# Patient Record
Sex: Male | Born: 1975 | Hispanic: Yes | Marital: Single | Smoking: Former smoker
Health system: Southern US, Community
[De-identification: ages and names within clinical notes are randomized; demographics above are authoritative.]

## PROBLEM LIST (undated history)

## (undated) DIAGNOSIS — K769 Liver disease, unspecified: Secondary | ICD-10-CM

## (undated) DIAGNOSIS — K219 Gastro-esophageal reflux disease without esophagitis: Secondary | ICD-10-CM

## (undated) HISTORY — DX: Gastro-esophageal reflux disease without esophagitis: K21.9

## (undated) HISTORY — PX: KNEE SURGERY: SHX244

## (undated) HISTORY — DX: Liver disease, unspecified: K76.9

## (undated) HISTORY — PX: JOINT REPLACEMENT: SHX530

---

## 2019-09-29 ENCOUNTER — Encounter: Payer: Self-pay | Admitting: Nurse Practitioner

## 2019-10-12 DIAGNOSIS — F1010 Alcohol abuse, uncomplicated: Secondary | ICD-10-CM

## 2019-10-12 DIAGNOSIS — K5900 Constipation, unspecified: Secondary | ICD-10-CM | POA: Insufficient documentation

## 2019-10-12 DIAGNOSIS — K219 Gastro-esophageal reflux disease without esophagitis: Secondary | ICD-10-CM | POA: Insufficient documentation

## 2019-10-12 DIAGNOSIS — R718 Other abnormality of red blood cells: Secondary | ICD-10-CM | POA: Insufficient documentation

## 2019-10-12 DIAGNOSIS — K920 Hematemesis: Secondary | ICD-10-CM | POA: Insufficient documentation

## 2019-10-12 DIAGNOSIS — R1013 Epigastric pain: Secondary | ICD-10-CM | POA: Insufficient documentation

## 2019-10-12 DIAGNOSIS — I10 Essential (primary) hypertension: Secondary | ICD-10-CM

## 2019-10-12 DIAGNOSIS — E119 Type 2 diabetes mellitus without complications: Secondary | ICD-10-CM

## 2019-10-12 HISTORY — DX: Alcohol abuse, uncomplicated: F10.10

## 2019-10-12 HISTORY — DX: Essential (primary) hypertension: I10

## 2019-10-12 HISTORY — DX: Type 2 diabetes mellitus without complications: E11.9

## 2019-10-18 ENCOUNTER — Ambulatory Visit: Payer: Self-pay | Admitting: Nurse Practitioner

## 2019-10-18 ENCOUNTER — Other Ambulatory Visit (INDEPENDENT_AMBULATORY_CARE_PROVIDER_SITE_OTHER): Payer: Self-pay

## 2019-10-18 ENCOUNTER — Encounter: Payer: Self-pay | Admitting: Nurse Practitioner

## 2019-10-18 VITALS — BP 112/70 | HR 72 | Ht 67.5 in | Wt 170.0 lb

## 2019-10-18 DIAGNOSIS — R194 Change in bowel habit: Secondary | ICD-10-CM

## 2019-10-18 DIAGNOSIS — F1010 Alcohol abuse, uncomplicated: Secondary | ICD-10-CM

## 2019-10-18 DIAGNOSIS — R1013 Epigastric pain: Secondary | ICD-10-CM

## 2019-10-18 DIAGNOSIS — K920 Hematemesis: Secondary | ICD-10-CM

## 2019-10-18 LAB — CBC
HCT: 34.6 % — ABNORMAL LOW (ref 39.0–52.0)
Hemoglobin: 12.1 g/dL — ABNORMAL LOW (ref 13.0–17.0)
MCHC: 35 g/dL (ref 30.0–36.0)
MCV: 92.7 fl (ref 78.0–100.0)
Platelets: 288 10*3/uL (ref 150.0–400.0)
RBC: 3.73 Mil/uL — ABNORMAL LOW (ref 4.22–5.81)
RDW: 13.4 % (ref 11.5–15.5)
WBC: 6.5 10*3/uL (ref 4.0–10.5)

## 2019-10-18 LAB — HEPATIC FUNCTION PANEL
ALT: 12 U/L (ref 0–53)
AST: 20 U/L (ref 0–37)
Albumin: 4.4 g/dL (ref 3.5–5.2)
Alkaline Phosphatase: 98 U/L (ref 39–117)
Bilirubin, Direct: 0.1 mg/dL (ref 0.0–0.3)
Total Bilirubin: 0.4 mg/dL (ref 0.2–1.2)
Total Protein: 7.6 g/dL (ref 6.0–8.3)

## 2019-10-18 LAB — LIPASE: Lipase: 34 U/L (ref 11.0–59.0)

## 2019-10-18 NOTE — Patient Instructions (Signed)
CALL THE BILLING DEPARTMENT FIRST THING TOMORROW TO SET UP A PAYMENT PLAN.   You have been scheduled for an endoscopy. Please follow written instructions given to you at your visit today. If you use inhalers (even only as needed), please bring them with you on the day of your procedure.   Your provider has requested that you go to the basement level for lab work before leaving today. Press "B" on the elevator. The lab is located at the first door on the left as you exit the elevator.    I appreciate the opportunity to care for you. Willette Cluster, NP-C

## 2019-10-18 NOTE — Progress Notes (Signed)
ASSESSMENT / PLAN:   44 year old male with PMH significant for alcohol abuse, hyperlipidemia, diabetes and hypertension  # Epigastric burning / nausea / vomiting / occasional hematemesis --Symptoms present for 6 months --Hemoglobin in March was normal.   Will update CBC today --Continue daily PPI --Will arrange for EGD. Dr. Barron Alvine has an opening next week.   --Advised to stop or significantly reduce alcohol consumption for the time being,  at least until evaluation complete --Given history of alcohol intake will obtain lipase level --If EGD negative consider RUQ ultrasound as he does have some tenderness in the RUQ  # Bowel changes --Having normal bowel movements alternating with constipation/diarrhea --Patient is at age for colon cancer screening.  Will plan for this to be done at a later date after resolution of more acute issues.  --In the interim he will apply for financial assistance.   # History of alcohol abuse --In the last 6 months he reduced amount to only 4-6 beers daily.  Patient mentions that he was told several years ago in [place] that he could have cirrhosis. --On exam patient does not have stigmata of chronic liver disease.  His liver test/CBC were normal in March.  I will update his labs --If liver test abnormal will obtain RUQ ultrasound --Advised to discontinue or significantly reduce consumption of EtOH    HPI:     Chief Complaint: Epigastric pain, bowel changes, hematemesis   Jeff Allen is a 44 year old male who speaks limited English.  He is here with a [place] interpreter.  Patient gives a 54-month history of burning pain in epigastrium.  Also for the last 6 months he has had vomiting on a daily basis and occasional episodes of hematemesis.  No heartburn no regurgitation.  No dysphagia.  No NSAID use . He has also had bowel changes in the form of alternating diarrhea, constipation and normal stools.  Patient eats a lot of fruits and  vegetables, feels like fiber intake is adequate.  A few times he has seen black stool but none in the last 3 weeks.  He has been on a PPI 1 sometimes 2 times a day with little improvement in upper abdominal symptoms.  He vomits only saliva with blood.  He does not vomit food and the vomiting is mostly in the a.m.  He does smoke marijuana but no more than once a month.  Patient says he used to be a heavy drinker but in the last 6 months he has reduced amount to only 4-6 beers a day.  Patient said that he was told several years in [place] that he could have cirrhosis.  Labs from PCPs office drawn early March show normal hemoglobin of 13, normal white count, normal platelets, normal renal function, normal liver test with the exception of alkaline phosphatase which was high at 183.   Past Medical History:  Diagnosis Date  . Alcohol abuse 10/12/2019  . Essential hypertension, benign 10/12/2019  . GERD (gastroesophageal reflux disease)   . Type 2 diabetes mellitus without complication (HCC) 10/12/2019    Past Surgical History:  Procedure Laterality Date  . JOINT REPLACEMENT    . KNEE SURGERY     Family History  Problem Relation Age of Onset  . Diabetes Mother   . Diabetes Father    Social History   Tobacco Use  . Smoking status: Former Smoker  Substance Use Topics  . Alcohol use: Not on file  .  Drug use: Not on file   Current Outpatient Medications  Medication Sig Dispense Refill  . atorvastatin (LIPITOR) 10 MG tablet Take 10 mg by mouth daily.    Marland Kitchen lisinopril (ZESTRIL) 20 MG tablet Take 20 mg by mouth daily.    . metFORMIN (GLUCOPHAGE-XR) 500 MG 24 hr tablet Take 1 tablet by mouth 2 (two) times daily.    . pantoprazole (PROTONIX) 40 MG tablet Take 40 mg by mouth daily.    . polyethylene glycol (MIRALAX / GLYCOLAX) 17 g packet Take 17 g by mouth daily.     No current facility-administered medications for this visit.   No Known Allergies   Review of Systems: Positive for allergies,  muscle pain and cramps, sleeping problems, swelling of feet and legs.  All other systems reviewed and negative except where noted in HPI.   Creatinine clearance cannot be calculated (No successful lab value found.)   Physical Exam:    Wt Readings from Last 3 Encounters:  No data found for Wt    BP 112/70   Pulse 72   Ht 5' 7.5" (1.715 m)   Wt 170 lb (77.1 kg)   BMI 26.23 kg/m  Constitutional:  Pleasant male in no acute distress. Psychiatric: Normal mood and affect. Behavior is normal. EENT: Pupils normal.  Conjunctivae are normal. No scleral icterus. Neck supple.  Cardiovascular: Normal rate, regular rhythm. No edema Pulmonary/chest: Effort normal and breath sounds normal. No wheezing, rales or rhonchi. Abdominal: Soft, nondistended, nontender. Bowel sounds active throughout. There are no masses palpable. No hepatomegaly. Neurological: Alert and oriented to person place and time. Skin: Skin is warm and dry. No rashes noted.  Tye Savoy, NP  10/18/2019, 3:45 PM  Cc:  Referring Provider Cathleen Corti, PA-C

## 2019-10-21 ENCOUNTER — Other Ambulatory Visit: Payer: Self-pay | Admitting: Gastroenterology

## 2019-10-21 ENCOUNTER — Ambulatory Visit (INDEPENDENT_AMBULATORY_CARE_PROVIDER_SITE_OTHER): Payer: Self-pay

## 2019-10-21 DIAGNOSIS — Z1159 Encounter for screening for other viral diseases: Secondary | ICD-10-CM

## 2019-10-25 LAB — SARS CORONAVIRUS 2 (TAT 6-24 HRS): SARS Coronavirus 2: NEGATIVE

## 2019-10-26 ENCOUNTER — Ambulatory Visit (AMBULATORY_SURGERY_CENTER): Payer: Self-pay | Admitting: Gastroenterology

## 2019-10-26 ENCOUNTER — Encounter: Payer: Self-pay | Admitting: Gastroenterology

## 2019-10-26 ENCOUNTER — Other Ambulatory Visit: Payer: Self-pay

## 2019-10-26 ENCOUNTER — Telehealth: Payer: Self-pay

## 2019-10-26 VITALS — BP 128/76 | HR 63 | Temp 96.8°F | Resp 21 | Ht 67.0 in | Wt 170.0 lb

## 2019-10-26 DIAGNOSIS — K2970 Gastritis, unspecified, without bleeding: Secondary | ICD-10-CM

## 2019-10-26 DIAGNOSIS — R1013 Epigastric pain: Secondary | ICD-10-CM

## 2019-10-26 DIAGNOSIS — R197 Diarrhea, unspecified: Secondary | ICD-10-CM

## 2019-10-26 DIAGNOSIS — B9681 Helicobacter pylori [H. pylori] as the cause of diseases classified elsewhere: Secondary | ICD-10-CM

## 2019-10-26 DIAGNOSIS — K920 Hematemesis: Secondary | ICD-10-CM

## 2019-10-26 DIAGNOSIS — R112 Nausea with vomiting, unspecified: Secondary | ICD-10-CM

## 2019-10-26 DIAGNOSIS — K2950 Unspecified chronic gastritis without bleeding: Secondary | ICD-10-CM

## 2019-10-26 MED ORDER — SODIUM CHLORIDE 0.9 % IV SOLN: 500.0000 mL | Freq: Once | INTRAVENOUS | Status: DC

## 2019-10-26 MED ORDER — PANTOPRAZOLE SODIUM 40 MG PO TBEC: DELAYED_RELEASE_TABLET | ORAL | 3 refills | Status: AC

## 2019-10-26 NOTE — Telephone Encounter (Signed)
Patient is scheduled to see Dr. Barron Alvine on 10/26/19 at 4pm. Tried to call patient on 10/21/19 to see if patient could come in early for his procedure. I was unable to reach patient, and was unable to leave a voicemail. Also tried to contact patient yesterday 10/25/19, and this morning 10/26/19 to see if patient could come in early, and was unsuccessful in reaching the patient.

## 2019-10-26 NOTE — Progress Notes (Signed)
pt tolerated well. VSS. awake and to recovery. Report given to RN.  

## 2019-10-26 NOTE — Patient Instructions (Signed)
Handout given for Gastritis.  Pick up your new prescription today.  YOU HAD AN ENDOSCOPIC PROCEDURE TODAY AT THE [facility]:   Refer to the procedure report that was given to you for any specific questions about what was found during the examination.  If the procedure report does not answer your questions, please call your gastroenterologist to clarify.  If you requested that your care partner not be given the details of your procedure findings, then the procedure report has been included in a sealed envelope for you to review at your convenience later.  YOU SHOULD EXPECT: Some feelings of bloating in the abdomen. Passage of more gas than usual.  Walking can help get rid of the air that was put into your GI tract during the procedure and reduce the bloating. If you had a lower endoscopy (such as a colonoscopy or flexible sigmoidoscopy) you may notice spotting of blood in your stool or on the toilet paper. If you underwent a bowel prep for your procedure, you may not have a normal bowel movement for a few days.  Please Note:  You might notice some irritation and congestion in your nose or some drainage.  This is from the oxygen used during your procedure.  There is no need for concern and it should clear up in a day or so.  SYMPTOMS TO REPORT IMMEDIATELY:   Following upper endoscopy (EGD)  Vomiting of blood or coffee ground material  New chest pain or pain under the shoulder blades  Painful or persistently difficult swallowing  New shortness of breath  Fever of 100F or higher  Black, tarry-looking stools  For urgent or emergent issues, a gastroenterologist can be reached at any hour by calling [phone]. Do not use MyChart messaging for urgent concerns.    DIET:  We do recommend a small meal at first, but then you may proceed to your regular diet.  Drink plenty of fluids but you should avoid alcoholic beverages for 24 hours.  ACTIVITY:  You should plan to take it easy  for the rest of today and you should NOT DRIVE or use heavy machinery until tomorrow (because of the sedation medicines used during the test).    FOLLOW UP: Our staff will call the number listed on your records 48-72 hours following your procedure to check on you and address any questions or concerns that you may have regarding the information given to you following your procedure. If we do not reach you, we will leave a message.  We will attempt to reach you two times.  During this call, we will ask if you have developed any symptoms of COVID 19. If you develop any symptoms (ie: fever, flu-like symptoms, shortness of breath, cough etc.) before then, please call [phone].  If you test positive for Covid 19 in the 2 weeks post procedure, please call and report this information to [place].    If any biopsies were taken you will be contacted by phone or by letter within the next 1-3 weeks.  Please call us at [phone] if you have not heard about the biopsies in 3 weeks.    SIGNATURES/CONFIDENTIALITY: You and/or your care partner have signed paperwork which will be entered into your electronic medical record.  These signatures attest to the fact that that the information above on your After Visit Summary has been reviewed and is understood.  Full responsibility of the confidentiality of this discharge information lies with you and/or your care-partner.

## 2019-10-26 NOTE — Op Note (Signed)
[name] Procedure Date: 10/26/2019 3:23 PM MRN: [id number] Endoscopist: Doristine Locks , MD Age: 44 Referring MD:  Date of Birth: 10-13-1975 Gender: Male Account #: [id number] Procedure:                Upper GI endoscopy Indications:              Epigastric abdominal pain, Upper abdominal pain,                            Hematemesis, Diarrhea/Change in bowel habits,                            Nausea with vomiting Medicines:                Monitored Anesthesia Care Procedure:                Pre-Anesthesia Assessment:                           - Prior to the procedure, a History and Physical                            was performed, and patient medications and                            allergies were reviewed. The patient's tolerance of                            previous anesthesia was also reviewed. The risks                            and benefits of the procedure and the sedation                            options and risks were discussed with the patient.                            All questions were answered, and informed consent                            was obtained. Prior Anticoagulants: The patient has                            taken no previous anticoagulant or antiplatelet                            agents. ASA Grade Assessment: III - A patient with                            severe systemic disease. After reviewing the risks                            and benefits, the patient was deemed in  satisfactory condition to undergo the procedure.                           After obtaining informed consent, the endoscope was                            passed under direct vision. Throughout the                            procedure, the patient's blood pressure, pulse, and                            oxygen saturations were monitored continuously. The                            Endoscope was introduced  through the mouth, and                            advanced to the second part of duodenum. The upper                            GI endoscopy was accomplished without difficulty.                            The patient tolerated the procedure well. Scope In: Scope Out: Findings:                 The examined esophagus was normal.                           The Z-line was regular and was found 38 cm from the                            incisors.                           The gastroesophageal flap valve was visualized                            endoscopically and classified as Hill Grade II                            (fold present, opens with respiration).                           Diffuse mild inflammation characterized by erythema                            was found in the gastric body and in the gastric                            antrum. Biopsies were taken with a cold forceps for                            Helicobacter pylori testing.  Estimated blood loss                            was minimal.                           The duodenal bulb, first portion of the duodenum                            and second portion of the duodenum were normal.                            Biopsies for histology were taken with a cold                            forceps for evaluation of celiac disease. Estimated                            blood loss was minimal. Complications:            No immediate complications. Estimated Blood Loss:     Estimated blood loss was minimal. Impression:               - Normal esophagus.                           - Z-line regular, 38 cm from the incisors.                           - Gastroesophageal flap valve classified as Hill                            Grade II (fold present, opens with respiration).                           - Gastritis. Biopsied.                           - Normal duodenal bulb, first portion of the                            duodenum and second portion of  the duodenum.                            Biopsied. Recommendation:           - Patient has a contact number available for                            emergencies. The signs and symptoms of potential                            delayed complications were discussed with the                            patient. Return to normal activities tomorrow.  Written discharge instructions were provided to the                            patient.                           - Resume previous diet.                           - Continue present medications.                           - Await pathology results.                           - Increase Protonix (pantoprazole) to 40 mg PO BID                            for 6 weeks to promote mucosal healing, then reduce                            back to 40 mg daily and continue to reduce to                            lowest effective dose if symptoms well controlled.                           - Return to GI clinic in 6 weeks. Doristine Locks, MD 10/26/2019 3:57:28 PM

## 2019-10-26 NOTE — Progress Notes (Signed)
Called to room to assist during endoscopic procedure.  Patient ID and intended procedure confirmed with present staff. Received instructions for my participation in the procedure from the performing physician.  

## 2019-10-28 ENCOUNTER — Telehealth: Payer: Self-pay

## 2019-10-28 NOTE — Telephone Encounter (Signed)
No answer on follow up call. No voicemail set up.  

## 2019-10-28 NOTE — Telephone Encounter (Signed)
2nd follow up call made.  NAULM 

## 2019-11-08 ENCOUNTER — Other Ambulatory Visit: Payer: Self-pay | Admitting: Gastroenterology

## 2019-11-08 DIAGNOSIS — K2970 Gastritis, unspecified, without bleeding: Secondary | ICD-10-CM

## 2019-11-08 MED ORDER — METRONIDAZOLE 250 MG PO TABS
250.0000 mg | ORAL_TABLET | Freq: Four times a day (QID) | ORAL | 0 refills | Status: AC
Start: 2019-11-08 — End: 2019-11-22

## 2019-11-08 MED ORDER — BISMUTH SUBSALICYLATE 262 MG PO CHEW
524.0000 mg | CHEWABLE_TABLET | Freq: Four times a day (QID) | ORAL | 0 refills | Status: AC
Start: 2019-11-08 — End: 2019-11-22

## 2019-11-08 MED ORDER — DOXYCYCLINE HYCLATE 100 MG PO TABS
100.0000 mg | ORAL_TABLET | Freq: Two times a day (BID) | ORAL | 0 refills | Status: AC
Start: 2019-11-08 — End: 2019-11-22

## 2019-11-16 ENCOUNTER — Encounter: Payer: Self-pay | Admitting: Gastroenterology

## 2020-01-04 ENCOUNTER — Telehealth: Payer: Self-pay | Admitting: Gastroenterology

## 2020-01-04 NOTE — Telephone Encounter (Signed)
Tried calling Mr Jeff Allen with no answer and no voicemail. I also tried his brother Jeff Allen listed as his emergency contact, he also was no answer and does not have his voicemail set up to be able to leave a message. Will try again before I leave for the end of the day.

## 2020-01-04 NOTE — Telephone Encounter (Signed)
Patient had EGD on 10/26/19.Biopsy confirmed H Pylori. Patient was started on quad therapy with antibiotics and to recheck stool around 12/20/19. Confirmed with [facility] that patient picked up his medication on 11/15/19. Patient continues to complain of pain. Has not rechecked stool at this time. Lady Gary will contact patient to let him know that he needs to get his lab work done and schedule an appointment for his abdominal pain.

## 2020-01-20 ENCOUNTER — Ambulatory Visit: Payer: Self-pay | Admitting: Gastroenterology

## 2020-01-20 ENCOUNTER — Encounter: Payer: Self-pay | Admitting: Gastroenterology

## 2020-01-20 VITALS — BP 140/80 | HR 73 | Ht 68.5 in | Wt 164.0 lb

## 2020-01-20 DIAGNOSIS — R112 Nausea with vomiting, unspecified: Secondary | ICD-10-CM

## 2020-01-20 DIAGNOSIS — F1210 Cannabis abuse, uncomplicated: Secondary | ICD-10-CM

## 2020-01-20 DIAGNOSIS — B9681 Helicobacter pylori [H. pylori] as the cause of diseases classified elsewhere: Secondary | ICD-10-CM

## 2020-01-20 DIAGNOSIS — R1013 Epigastric pain: Secondary | ICD-10-CM

## 2020-01-20 DIAGNOSIS — K2970 Gastritis, unspecified, without bleeding: Secondary | ICD-10-CM

## 2020-01-20 NOTE — Patient Instructions (Signed)
If you are age 44 or older, your body mass index should be between 23-30. Your Body mass index is 24.57 kg/m. If this is out of the aforementioned range listed, please consider follow up with your Primary Care Provider.  If you are age 36 or younger, your body mass index should be between 19-25. Your Body mass index is 24.57 kg/m. If this is out of the aformentioned range listed, please consider follow up with your Primary Care Provider.   You have been scheduled for a gastric emptying scan at [facility] on 01/25/20 at 9:30 am. Please arrive at least 15 minutes prior to your appointment for registration. Please make certain not to have anything to eat or drink after midnight the night before your test. Hold all stomach medications (ex: Zofran, phenergan, Reglan) 48 hours prior to your test. If you need to reschedule your appointment, please contact radiology scheduling at [phone]. _____________________________________________________________________ A gastric-emptying study measures how long it takes for food to move through your stomach. There are several ways to measure stomach emptying. In the most common test, you eat food that contains a small amount of radioactive material. A scanner that detects the movement of the radioactive material is placed over your abdomen to monitor the rate at which food leaves your stomach. This test normally takes about 4 hours to complete. _____________________________________________________________________  Your provider has requested that you go to the basement level for lab work at [address], [place]. Press "B" on the elevator. The lab is located at the first door on the left as you exit the elevator. STOP PROTONIX TWO WEEKS BEFORE 02/20/20  Follow up with me on 03/22/20 at 8:00 am.  It was a pleasure to see you today!  Vito Cirigliano, D.O.

## 2020-01-20 NOTE — Progress Notes (Signed)
P  Chief Complaint:    MEG pain, nausea/vomiting, hospital f/u  GI History: 44 year old male with history of EtOH use disorder (has since quit), HLD, diabetes, HTN, initially seen in the GI clinic on 10/18/2019 with c/o:  1) MEG pain, nausea/vomiting, hematemesis: Symptoms started late 2020 with daily nausea/vomiting and intermittent hematemesis.  No heartburn, regurgitation, dysphagia.  Marijuana twice/week.  Prior heavy EtOH use, but decreased to 4-6 beers/day with symptom onset; has since quit. -09/2019: H/H 12.1/34.6, MCV 92.7.  Normal liver enzymes, lipase -EGD with H. pylori gastritis; treated with quadruple therapy -Treated with high-dose PPI x6 weeks -12/24/2019: Admitted to [facility] with left sided pneumomediastinum, treated with ABX and conservative management -01/18/2020: Admitted to [facility] with chest pain and persistent nausea/vomiting.  CT unremarkable, responded to conservative management  2) Change in bowel habits: Also started late 2020.  Alternating diarrhea, constipation, and normal stools.  Intermittent dark stools, but no hematochezia.  Lower GI symptoms have since resolved   Endoscopic History: -EGD (10/26/2019, Dr. Barron Alvine): Mild gastritis (biopsies: Positive for H. pylori).  HPI:     Patient is a 44 y.o. male presenting to the Gastroenterology Clinic for follow-up.  Hospital admission 12/24/19 at [facility] in July with left-sided pneumomediastinum with subQ emphysema, nausea/vomiting. CT surgerry consulted- recommended conservative management. Gastrograffin study negative for perforation. Treated with Abx, diflucan. Repeat esophagram negative and was tolerating PO intake. Repeat CT 12/28/19 without perforation. Inpatient GI recommended outpatient f/u and GES and started Reglan.   Was seen in the ER on 01/18/2020 with c/o nausea/vomiting, retching, midsternal chest pain.  CT unremarkable, EKG normal, negative troponins.  Was admitted to [facility] for intractable nausea/vomiting,  treated with antiemetics and was discharged yesterday with PPI, Carafate, Zofran ODT.   Today, he states he feels okay this morning, but continues to have recurrent episodes we can manage him in the hospital earlier this week as above.  Stopped EtOH 2-3 months ago. Marijuana still 2 days/week.  Take medications as prescribed at the time of hospital discharge yesterday, although does not seem to be taking the Reglan despite being in his medical list and along with the bottles of medication he is caring today.  History obtained via interpreter in the room, although patient understands a good amount of English.  Review of systems:     No chest pain, no SOB, no fevers, no urinary sx   Past Medical History:  Diagnosis Date  . Alcohol abuse 10/12/2019  . Essential hypertension, benign 10/12/2019  . GERD (gastroesophageal reflux disease)   . Liver disease   . Type 2 diabetes mellitus without complication (HCC) 10/12/2019    Patient's surgical history, family medical history, social history, medications and allergies were all reviewed in Epic    Current Outpatient Medications  Medication Sig Dispense Refill  . atorvastatin (LIPITOR) 10 MG tablet Take 10 mg by mouth daily.    Marland Kitchen lisinopril (ZESTRIL) 20 MG tablet Take 20 mg by mouth daily.    . metFORMIN (GLUCOPHAGE) 500 MG tablet metformin 500 mg tablet  Take 2 tablets twice a day by oral route.    . metoCLOPramide (REGLAN) 5 MG tablet metoclopramide 5 mg tablet  Take 1 tablet 3 times a day by oral route with meals.    Marland Kitchen oxyCODONE-acetaminophen (PERCOCET/ROXICET) 5-325 MG tablet Take 1 tablet by mouth every 6 (six) hours as needed for severe pain.    . pantoprazole (PROTONIX) 40 MG tablet Increase Protonix to 40 mg twice a day for 6 weeks,  then reduce back to 40 mg daily. 90 tablet 3  . sucralfate (CARAFATE) 1 GM/10ML suspension Take 1 g by mouth 2 (two) times daily before a meal.     No current facility-administered medications for this visit.      Physical Exam:     BP 140/80   Pulse 73   Ht 5' 8.5" (1.74 m)   Wt 164 lb (74.4 kg)   BMI 24.57 kg/m   GENERAL:  Pleasant male in NAD PSYCH: : Cooperative, normal affect EENT:  conjunctiva pink, mucous membranes moist, neck supple without masses, no crepitus CARDIAC:  RRR, no murmur heard, no peripheral edema PULM: Normal respiratory effort, lungs CTA bilaterally, no wheezing ABDOMEN:  Nondistended, soft, nontender. No obvious masses, no hepatomegaly,  normal bowel sounds SKIN:  turgor, no lesions seen Musculoskeletal:  Normal muscle tone, normal strength NEURO: Alert and oriented x 3, no focal neurologic deficits   IMPRESSION and PLAN:    1) Nausea/Vomiting 2) Epigastric pain/Chest pain 3) Recent admission for left-sided pneumomediastinum 4) H. pylori gastritis 5) Cannabis use  - GES.  Needs to be off Reglan x2 weeks.  No pain medications. - Check H pylori stool Ag to confirm eradication after holding PPI x2+ weeks -Counseled on complete cessation of all cannabis -Low-fat/low fiber diet -If work-up unrevealing, plan for CT head  6) History of EtOH use disorder -Has since stopped drinking EtOH.  No withdrawal symptoms or cravings.  Applauded him on his cessation efforts  RTC in 2 months or sooner as needed     I spent 35 minutes of time, including in depth chart review, independent review of results as outlined above, communicating results with the patient directly, face-to-face time with the patient, coordinating care, and ordering studies and medications as appropriate, and documentation.    Verlin Dike Melaina Howerton ,DO, FACG 01/20/2020, 9:12 AM

## 2020-01-25 ENCOUNTER — Encounter (HOSPITAL_COMMUNITY)
Admission: RE | Admit: 2020-01-25 | Discharge: 2020-01-25 | Disposition: A | Discharge status: Home / Self Care | Payer: Self-pay | Source: Ambulatory Visit | Attending: Gastroenterology | Admitting: Gastroenterology

## 2020-01-25 ENCOUNTER — Other Ambulatory Visit: Payer: Self-pay

## 2020-01-25 DIAGNOSIS — R112 Nausea with vomiting, unspecified: Secondary | ICD-10-CM | POA: Insufficient documentation

## 2020-01-25 MED ORDER — TECHNETIUM TC 99M SULFUR COLLOID
1.9200 | Freq: Once | INTRAVENOUS | Status: AC | PRN
  Administered 2020-01-25: 1.92 via INTRAVENOUS

## 2020-02-21 ENCOUNTER — Other Ambulatory Visit: Payer: Self-pay

## 2020-02-21 DIAGNOSIS — B9681 Helicobacter pylori [H. pylori] as the cause of diseases classified elsewhere: Secondary | ICD-10-CM

## 2020-02-22 ENCOUNTER — Other Ambulatory Visit: Payer: Self-pay

## 2020-02-22 DIAGNOSIS — B9681 Helicobacter pylori [H. pylori] as the cause of diseases classified elsewhere: Secondary | ICD-10-CM

## 2020-02-23 LAB — HELICOBACTER PYLORI  SPECIAL ANTIGEN
MICRO NUMBER:: 11010318
SPECIMEN QUALITY: ADEQUATE

## 2020-03-01 ENCOUNTER — Telehealth: Payer: Self-pay

## 2020-03-01 NOTE — Telephone Encounter (Signed)
Per Joey he has tried to contact patient regarding his results. I will ask Joey to send patient a letter in [place].

## 2020-03-06 ENCOUNTER — Encounter: Payer: Self-pay | Admitting: Gastroenterology

## 2020-03-06 NOTE — Progress Notes (Signed)
Called Mr. Bradly Bienenstock but no answer, no voicemail. Called Emergency contact listed Madelin Headings, asked him to have Mr. Bradly Bienenstock call back and ask to speak to me or [place] representative.

## 2020-03-22 ENCOUNTER — Ambulatory Visit: Payer: Self-pay | Admitting: Gastroenterology

## 2020-03-22 ENCOUNTER — Encounter: Payer: Self-pay | Admitting: Gastroenterology

## 2020-03-22 VITALS — BP 136/78 | HR 83 | Ht 69.0 in | Wt 176.4 lb

## 2020-03-22 DIAGNOSIS — R112 Nausea with vomiting, unspecified: Secondary | ICD-10-CM

## 2020-03-22 DIAGNOSIS — Z8619 Personal history of other infectious and parasitic diseases: Secondary | ICD-10-CM

## 2020-03-22 DIAGNOSIS — R1013 Epigastric pain: Secondary | ICD-10-CM

## 2020-03-22 NOTE — Progress Notes (Signed)
P  Chief Complaint:    Nausea/Vomiting, epigastric pain  GI History: 44 year old male with history of EtOH use disorder (has since quit), HLD, diabetes, HTN, initially seen in the GI clinic on 10/18/2019 with c/o:  1) MEG pain, nausea/vomiting, hematemesis: Symptoms started late 2020 with daily nausea/vomiting and intermittent hematemesis.  No heartburn, regurgitation, dysphagia.  Marijuana twice/week.  Prior heavy EtOH use, but decreased to 4-6 beers/day with symptom onset; has since quit. -09/2019: H/H 12.1/34.6, MCV 92.7.  Normal liver enzymes, lipase -EGD with H. pylori gastritis; treated with quadruple therapy -Treated with high-dose PPI x6 weeks -12/24/2019: Admitted to [facility] with left sided pneumomediastinum, treated with ABX and conservative management -01/18/2020: Admitted to [facility] with chest pain and persistent nausea/vomiting.  CT unremarkable, responded to conservative management -01/25/2020: GES: Normal -01/2020: Cirfirmed eradication of H pylori on stool Ag test  2) Change in bowel habits: Also started late 2020.  Alternating diarrhea, constipation, and normal stools.  Intermittent dark stools, but no hematochezia.  Lower GI symptoms have since resolved   Endoscopic History: -EGD (10/26/2019, Dr. Barron Alvine): Mild gastritis (biopsies: Positive for H. pylori).  HPI:     Patient is a 44 y.o. male presenting to the Gastroenterology Clinic for follow-up.  Last seen in GI clinic on 01/20/2020.  At that time, was feeling okay following hospital discharge as above, but still with recurrent episodes.  Had quit all EtOH, but still smoking marijuana 2 days/week.  Was not taking Reglan at that time.  Since last appointment, was evaluated with GES (off Reglan; normal) and repeat stool antigen to confirm eradication of H. pylori.  Recommended low-fat/low fiber diet; no change in his symptoms and has since advance diet back.  Today, he states sxs all unchanged. Still MEG pain, n/v. Weight  stable.   Pain indepepndent of PO intake.  Not taking any of his medications since last appt, including meds for DM, HLD, HTN.  History obtained by Spanish interpreter in room today.  Review of systems:     No chest pain, no SOB, no fevers, no urinary sx   Past Medical History:  Diagnosis Date   Alcohol abuse 10/12/2019   Essential hypertension, benign 10/12/2019   GERD (gastroesophageal reflux disease)    Liver disease    Type 2 diabetes mellitus without complication (HCC) 10/12/2019    Patient's surgical history, family medical history, social history, medications and allergies were all reviewed in Epic    Current Outpatient Medications  Medication Sig Dispense Refill   atorvastatin (LIPITOR) 10 MG tablet Take 10 mg by mouth daily. (Patient not taking: Reported on 03/22/2020)     lisinopril (ZESTRIL) 20 MG tablet Take 20 mg by mouth daily. (Patient not taking: Reported on 03/22/2020)     metFORMIN (GLUCOPHAGE) 500 MG tablet metformin 500 mg tablet  Take 2 tablets twice a day by oral route. (Patient not taking: Reported on 03/22/2020)     metoCLOPramide (REGLAN) 5 MG tablet metoclopramide 5 mg tablet  Take 1 tablet 3 times a day by oral route with meals. (Patient not taking: Reported on 03/22/2020)     oxyCODONE-acetaminophen (PERCOCET/ROXICET) 5-325 MG tablet Take 1 tablet by mouth every 6 (six) hours as needed for severe pain. (Patient not taking: Reported on 03/22/2020)     pantoprazole (PROTONIX) 40 MG tablet Increase Protonix to 40 mg twice a day for 6 weeks, then reduce back to 40 mg daily. (Patient not taking: Reported on 03/22/2020) 90 tablet 3   sucralfate (CARAFATE) 1  GM/10ML suspension Take 1 g by mouth 2 (two) times daily before a meal. (Patient not taking: Reported on 03/22/2020)     No current facility-administered medications for this visit.    Physical Exam:     BP 136/78    Pulse 83    Ht 5\' 9"  (1.753 m)    Wt 176 lb 6 oz (80 kg)    BMI 26.05 kg/m     GENERAL:  Pleasant male in NAD PSYCH: : Cooperative, normal affect EENT:  conjunctiva pink, mucous membranes moist, neck supple without masses CARDIAC:  RRR, no murmur heard, no peripheral edema PULM: Normal respiratory effort, lungs CTA bilaterally, no wheezing ABDOMEN:  Nondistended, soft, nontender. No obvious masses, no hepatomegaly,  normal bowel sounds SKIN:  turgor, no lesions seen Musculoskeletal:  Normal muscle tone, normal strength NEURO: Alert and oriented x 3, no focal neurologic deficits   IMPRESSION and PLAN:    1) Nausea/Vomiting 2) Epigastric pain 3) History of pneumomediastinum  -Has had multiple imaging studies, to include CT abdomen pelvis on 01/18/2020, CT CAP on 12/29/2019, CT abdomen/pelvis on 12/24/2019 due to recent hospitalizations, alcohol unrevealing for GI pathology for presenting symptoms.  Repeat abdominal cross-sectional imaging not needed at this time -CT head to rule out intracranial lesion -If unrevealing, can either test or treat for possible SIBO -Trial course of FD guard.  Provided with samples today -Again discussed cannabinoid hyperemesis syndrome  4) Diabetes -Recommended restarting medications and establish follow-up with PCM  5) History of H. pylori -Eradicated with quadruple therapy  6) Cannabis use -Discussed possibility of cannabinoid hyperemesis syndrome again today      I spent 30 minutes of time, including in depth chart review, independent review of results as outlined above, communicating results with the patient directly, face-to-face time with the patient, coordinating care, and ordering studies and medications as appropriate, and documentation.     12/26/2019 Harlem Thresher ,DO, FACG 03/22/2020, 8:45 AM

## 2020-03-22 NOTE — Patient Instructions (Addendum)
If you are age 44 or older, your body mass index should be between 23-30. Your Body mass index is 26.05 kg/m. If this is out of the aforementioned range listed, please consider follow up with your Primary Care Provider.  If you are age 45 or younger, your body mass index should be between 19-25. Your Body mass index is 26.05 kg/m. If this is out of the aformentioned range listed, please consider follow up with your Primary Care Provider.  __________________________________________________________ We have given you samples of FD guard. Take 1 capsule 2 times a day as directed.   _________________________________________________________ [place] are scheduled on 03/23/2020 at 1:30pm. You should arrive 15 minutes prior to your appointment time for registration. Please follow the written instructions below on the day of your exam:  WARNING: IF YOU ARE ALLERGIC TO IODINE/X-RAY DYE, PLEASE NOTIFY RADIOLOGY IMMEDIATELY AT [phone]! YOU WILL BE GIVEN A 13 HOUR PREMEDICATION PREP.  1) Do not eat or drink anything after 9:30am(4 hours prior to your test) 2) You have been given 2 bottles of oral contrast to drink. The solution may taste better if refrigerated, but do NOT add ice or any other liquid to this solution. Shake well before drinking.    Drink 1 bottle of contrast @ 11:30am (2 hours prior to your exam)  Drink 1 bottle of contrast @ 12:30pm (1 hour prior to your exam)  You may take any medications as prescribed with a small amount of water, if necessary. If you take any of the following medications: METFORMIN, GLUCOPHAGE, GLUCOVANCE, AVANDAMET, RIOMET, FORTAMET, [place] MET, JANUMET, GLUMETZA or METAGLIP, you MAY be asked to HOLD this medication 48 hours AFTER the exam.  The purpose of you drinking the oral contrast is to aid in the visualization of your intestinal tract. The contrast solution may cause some diarrhea. Depending on your individual set of symptoms, you may  also receive an intravenous injection of x-ray contrast/dye. Plan on being at [facility] for 30 minutes or longer, depending on the type of exam you are having performed.  This test typically takes 30-45 minutes to complete. ___________________________________________________________  Due to recent changes in healthcare laws, you may see the results of your imaging and laboratory studies on MyChart before your provider has had a chance to review them.  We understand that in some cases there may be results that are confusing or concerning to you. Not all laboratory results come back in the same time frame and the provider may be waiting for multiple results in order to interpret others.  Please give [place] 48 hours in order for your provider to thoroughly review all the results before contacting the office for clarification of your results.

## 2020-03-23 ENCOUNTER — Other Ambulatory Visit: Payer: Self-pay

## 2020-03-23 ENCOUNTER — Ambulatory Visit (HOSPITAL_BASED_OUTPATIENT_CLINIC_OR_DEPARTMENT_OTHER): Admission: RE | Admit: 2020-03-23 | Payer: Self-pay | Source: Ambulatory Visit

## 2020-03-23 ENCOUNTER — Telehealth: Payer: Self-pay | Admitting: General Surgery

## 2020-03-23 ENCOUNTER — Ambulatory Visit (HOSPITAL_BASED_OUTPATIENT_CLINIC_OR_DEPARTMENT_OTHER)
Admission: RE | Admit: 2020-03-23 | Discharge: 2020-03-23 | Disposition: A | Discharge status: Home / Self Care | Payer: Self-pay | Source: Ambulatory Visit | Attending: Gastroenterology | Admitting: Gastroenterology

## 2020-03-23 DIAGNOSIS — R112 Nausea with vomiting, unspecified: Secondary | ICD-10-CM | POA: Insufficient documentation

## 2020-03-23 NOTE — Telephone Encounter (Signed)
Changed to ct without contrast. No labs were completed for contrast

## 2020-06-01 ENCOUNTER — Other Ambulatory Visit (INDEPENDENT_AMBULATORY_CARE_PROVIDER_SITE_OTHER): Payer: Self-pay

## 2020-06-01 ENCOUNTER — Other Ambulatory Visit: Payer: Self-pay

## 2020-06-01 ENCOUNTER — Encounter: Payer: Self-pay | Admitting: Gastroenterology

## 2020-06-01 ENCOUNTER — Ambulatory Visit (INDEPENDENT_AMBULATORY_CARE_PROVIDER_SITE_OTHER): Payer: Self-pay | Admitting: Gastroenterology

## 2020-06-01 VITALS — BP 138/72 | HR 76 | Ht 69.0 in | Wt 171.0 lb

## 2020-06-01 DIAGNOSIS — R1013 Epigastric pain: Secondary | ICD-10-CM

## 2020-06-01 DIAGNOSIS — R112 Nausea with vomiting, unspecified: Secondary | ICD-10-CM

## 2020-06-01 DIAGNOSIS — E119 Type 2 diabetes mellitus without complications: Secondary | ICD-10-CM

## 2020-06-01 LAB — CREATININE, SERUM: Creat: 0.92 mg/dL (ref 0.60–1.35)

## 2020-06-01 MED ORDER — PROCHLORPERAZINE MALEATE 10 MG PO TABS: 10.0000 mg | ORAL_TABLET | Freq: Three times a day (TID) | ORAL | 3 refills | Status: AC | PRN

## 2020-06-01 NOTE — Patient Instructions (Addendum)
If you are age 45 or older, your body mass index should be between 23-30. Your Body mass index is 25.25 kg/m. If this is out of the aforementioned range listed, please consider follow up with your Primary Care Provider.  If you are age 41 or younger, your body mass index should be between 19-25. Your Body mass index is 25.25 kg/m. If this is out of the aformentioned range listed, please consider follow up with your Primary Care Provider.   We have sent the following medications to your pharmacy for you to pick up at your convenience:  Compazine 56m   Please go to the 2nd floor of this building [address] to schedule your labwork. This must be done before your testing. ____________________________________________________________[facility] on 1/17/2022Please arrive 15 minutes prior to your scheduled appointment at  72:95MW Make certain not to have anything to eat or drink at least 6 hours prior to your test. Should this appointment date or time not work well for you, please call radiology scheduling at [phone]  _____________________________________________________________________ hepatobiliary (HIDA) scan is an imaging procedure used to diagnose problems in the liver, gallbladder and bile ducts. In the HIDA scan, a radioactive chemical or tracer is injected into a vein in your arm. The tracer is handled by the liver like bile. Bile is a fluid produced and excreted by your liver that helps your digestive system break down fats in the foods you eat. Bile is stored in your gallbladder and the gallbladder releases the bile when you eat a meal. A special nuclear medicine scanner (gamma camera) tracks the flow of the tracer from your liver into your gallbladder and small intestine.  During your HIDA scan  You'll be asked to change into a hospital gown before your HIDA scan begins. Your health care team will position you on a table, usually  on your back. The radioactive tracer is then injected into a vein in your arm.The tracer travels through your bloodstream to your liver, where it's taken up by the bile-producing cells. The radioactive tracer travels with the bile from your liver into your gallbladder and through your bile ducts to your small intestine.You may feel some pressure while the radioactive tracer is injected into your vein. As you lie on the table, a special gamma camera is positioned over your abdomen taking pictures of the tracer as it moves through your body. The gamma camera takes pictures continually for about an hour. You'll need to keep still during the HIDA scan. This can become uncomfortable, but you may find that you can lessen the discomfort by taking deep breaths and thinking about other things. Tell your health care team if you're uncomfortable. The radiologist will watch on a computer the progress of the radioactive tracer through your body. The HIDA scan may be stopped when the radioactive tracer is seen in the gallbladder and enters your small intestine. This typically takes about an hour. In some cases extra imaging will be performed if original images aren't satisfactory, if morphine is given to help visualize the gallbladder or if the medication CCK is given to look at the contraction of the gallbladder. This test typically takes 2 hours to complete. ________________________________________________________________________  YDennis Bastare scheduled on 06/06/2020 at 1:30p,. You should arrive 15 minutes prior to your appointment time for registration. Please follow the written instructions below on the day of your exam:  WARNING: IF YOU ARE ALLERGIC TO IODINE/X-RAY DYE,  PLEASE NOTIFY RADIOLOGY IMMEDIATELY AT [phone]! YOU WILL BE GIVEN A 13 HOUR PREMEDICATION PREP.  1) Do not eat or drink anything after  (4 hours prior to your test) 2) You have been given 2 bottles of oral contrast to drink. The solution may taste  better if refrigerated, but do NOT add ice or any other liquid to this solution. Shake well before drinking.    Drink 1 bottle of contrast @ 11:30am (2 hours prior to your exam)  Drink 1 bottle of contrast @ 12:30pm (1 hour prior to your exam)  You may take any medications as prescribed with a small amount of water, if necessary. If you take any of the following medications: METFORMIN, GLUCOPHAGE, GLUCOVANCE, AVANDAMET, RIOMET, FORTAMET, [place] MET, JANUMET, GLUMETZA or METAGLIP, you MAY be asked to HOLD this medication 48 hours AFTER the exam.  The purpose of you drinking the oral contrast is to aid in the visualization of your intestinal tract. The contrast solution may cause some diarrhea. Depending on your individual set of symptoms, you may also receive an intravenous injection of x-ray contrast/dye. Plan on being at [facility] for 30 minutes or longer, depending on the type of exam you are having performed.  This test typically takes 30-45 minutes to complete.   Follow up in 3 months   Due to recent changes in healthcare laws, you may see the results of your imaging and laboratory studies on MyChart before your provider has had a chance to review them.  We understand that in some cases there may be results that are confusing or concerning to you. Not all laboratory results come back in the same time frame and the provider may be waiting for multiple results in order to interpret others.  Please give [place] 48 hours in order for your provider to thoroughly review all the results before contacting the office for clarification of your results.   Thank you for choosing me and [facility].  Vito Cirigliano, D.O.

## 2020-06-01 NOTE — Progress Notes (Signed)
P  Chief Complaint:    MEG pain, nausea/vomiting  GI History: 45 year old male with history of EtOH use disorder(has since quit), HLD, diabetes, HTN, initially seen in the GI clinic on 10/18/2019 withc/o:  1)MEG pain, nausea/vomiting, hematemesis: Symptoms started late 2020 with daily nausea/vomiting and intermittent hematemesis. No heartburn, regurgitation, dysphagia. Marijuana twice/week. Prior heavy EtOH use, but decreased to 4-6 beers/day with symptom onset; has since quit. -09/2019: H/H 12.1/34.6, MCV 92.7. Normal liver enzymes, lipase -EGD with H. pylori gastritis; treated with quadruple therapy -Treated with high-dose PPI x6 weeks -12/24/2019: Admitted to [facility] with left sided pneumomediastinum, treated with ABX and conservative management (CT CAP x2 w/o other GI pathology) -01/18/2020: Admitted to [facility] with chest pain and persistent nausea/vomiting. CT unremarkable, responded to conservative management -01/25/2020: GES: Normal -01/2020: Confirmed eradication of H pylori on stool Ag test - 02/2020: CT head normal  2) Change in bowel habits: Also started late 2020. Alternating diarrhea, constipation, and normal stools. Intermittent dark stools, but no hematochezia.Lower GI symptoms have since resolved   Endoscopic History: -EGD (10/26/2019, Dr. Barron Alvine): Mild gastritis (biopsies: Positive for H. pylori).  HPI:     Patient is a 45 y.o. male presenting to the Gastroenterology Clinic for follow-up.  Last seen in the GI clinic on 03/22/2020.  Extensive evaluation for UGI symptoms has been largely unrevealing.  Was still having MEG pain, nausea/vomiting at that appointment; was still smoking marijuana daily/week.  Was no longer taking any medications, including meds for DM, HLD, HTN.  Plan at that time was for CT head (negative), trial of FD guard (provided with samples), recommended stop all marijuana, with consideration for empiric treatment of SIBO.  Additionally  recommended he restart his medications for chronic medical conditions.  Today, he states he continues to have his index MEG pain and nausea/vomiting.  Stopped all marijuana and still no EtOH. No improvement with FDGuard. Modified to very healthy diet; no change. Will still have sxs with fasting.  No LGI symptoms.   Review of systems:     No chest pain, no SOB, no fevers, no urinary sx   Past Medical History:  Diagnosis Date  . Alcohol abuse 10/12/2019  . Essential hypertension, benign 10/12/2019  . GERD (gastroesophageal reflux disease)   . Liver disease   . Type 2 diabetes mellitus without complication (HCC) 10/12/2019    Patient's surgical history, family medical history, social history, medications and allergies were all reviewed in Epic    Current Outpatient Medications  Medication Sig Dispense Refill  . lisinopril (ZESTRIL) 20 MG tablet Take 20 mg by mouth daily.    . metFORMIN (GLUCOPHAGE) 500 MG tablet metformin 500 mg tablet  Take 2 tablets twice a day by oral route.    Marland Kitchen atorvastatin (LIPITOR) 10 MG tablet Take 10 mg by mouth daily. (Patient not taking: No sig reported)    . metoCLOPramide (REGLAN) 5 MG tablet metoclopramide 5 mg tablet  Take 1 tablet 3 times a day by oral route with meals. (Patient not taking: Reported on 03/22/2020)    . oxyCODONE-acetaminophen (PERCOCET/ROXICET) 5-325 MG tablet Take 1 tablet by mouth every 6 (six) hours as needed for severe pain. (Patient not taking: Reported on 03/22/2020)    . pantoprazole (PROTONIX) 40 MG tablet Increase Protonix to 40 mg twice a day for 6 weeks, then reduce back to 40 mg daily. (Patient not taking: Reported on 03/22/2020) 90 tablet 3  . sucralfate (CARAFATE) 1 GM/10ML suspension Take 1 g by mouth 2 (  two) times daily before a meal. (Patient not taking: Reported on 03/22/2020)     No current facility-administered medications for this visit.    Physical Exam:     Ht 5\' 9"  (1.753 m)   Wt 171 lb (77.6 kg)   BMI 25.25  kg/m   GENERAL:  Pleasant male in NAD PSYCH: : Cooperative, normal affect EENT:  conjunctiva pink, mucous membranes moist, neck supple without masses CARDIAC:  RRR, no murmur heard, no peripheral edema PULM: Normal respiratory effort, lungs CTA bilaterally, no wheezing ABDOMEN: Pinpoint pain in the subxiphoid epigastrium.  Overlying skin normal appearing.   Negative Carnett's sign.  Negative Murphy sign.  Nondistended, soft.  No peritoneal signs.  No rebound or guarding.  No obvious masses, no hepatomegaly,  normal bowel sounds SKIN:  turgor, no lesions seen Musculoskeletal:  Normal muscle tone, normal strength NEURO: Alert and oriented x 3, no focal neurologic deficits   IMPRESSION and PLAN:    1) Midepigastric pain  2) Nausea/Vomiting 3) History of pneumomediastinum  Has had an extensive work-up for persistent abdominal pain and nausea/vomiting without clear etiology.  No improvement with trial of medications as above.  No improvement despite adequate treatment of H. pylori.  Multiple CT scans during admission to [facility] did not reveal any other GI/intra-abdominal pathology.  While he does have pinpoint, reproducible pain, strongly negative Carnett's sign points away from abdominal wall syndrome.  Plan as follows:  - HIDA scan to rule out biliary pathology - CT angiography - Compazine for nausea (no response to Reglan, Zofran was mildly responsive) - If work-up still unrevealing persistent symptoms, plan for referral to academic center or Pain Management - Has quit marijuana and EtOH  4) Diabetes - Needs to establish with PCM  I spent 30 minutes of time, including in depth chart review, independent review of results as outlined above, communicating results with the patient directly, face-to-face time with the patient, coordinating care, and ordering studies and medications as appropriate, and documentation.           [facility] ,DO, FACG 06/01/2020, 1:41 PM

## 2020-06-06 ENCOUNTER — Telehealth: Payer: Self-pay | Admitting: General Surgery

## 2020-06-06 ENCOUNTER — Other Ambulatory Visit: Payer: Self-pay | Admitting: Gastroenterology

## 2020-06-06 ENCOUNTER — Other Ambulatory Visit: Payer: Self-pay

## 2020-06-06 ENCOUNTER — Ambulatory Visit (HOSPITAL_COMMUNITY)
Admission: RE | Admit: 2020-06-06 | Discharge: 2020-06-06 | Disposition: A | Discharge status: Home / Self Care | Payer: Self-pay | Source: Ambulatory Visit | Attending: Gastroenterology | Admitting: Gastroenterology

## 2020-06-06 DIAGNOSIS — R112 Nausea with vomiting, unspecified: Secondary | ICD-10-CM

## 2020-06-06 DIAGNOSIS — R1013 Epigastric pain: Secondary | ICD-10-CM

## 2020-06-06 MED ORDER — IOHEXOL 300 MG/ML  SOLN: 100.0000 mL | Freq: Once | INTRAMUSCULAR | Status: DC | PRN

## 2020-06-06 MED ORDER — IOHEXOL 350 MG/ML SOLN
100.0000 mL | Freq: Once | INTRAVENOUS | Status: AC | PRN
  Administered 2020-06-06: 100 mL via INTRAVENOUS

## 2020-06-06 NOTE — Telephone Encounter (Signed)
-----   Message from [facility] V, DO sent at 06/06/2020  3:36 PM EST ----- There is CTA was essentially normal, without any vascular abnormalities.  The CT portion was also normal, without any acute abnormality in the abdomen or pelvis to explain symptoms.  Will follow-up on HIDA scan and if unremarkable, plan for referral to academic center for second opinion.

## 2020-06-06 NOTE — Telephone Encounter (Signed)
Notified patient that his results were normal and we should proceed with the HIDA scan for further evaluation. Patient verbalized understanding.

## 2020-06-11 ENCOUNTER — Encounter (HOSPITAL_COMMUNITY): Admission: RE | Admit: 2020-06-11 | Payer: Self-pay | Source: Ambulatory Visit

## 2020-06-11 ENCOUNTER — Ambulatory Visit (HOSPITAL_COMMUNITY)
Admission: RE | Admit: 2020-06-11 | Discharge: 2020-06-11 | Disposition: A | Discharge status: Home / Self Care | Payer: Self-pay | Source: Ambulatory Visit | Attending: Gastroenterology | Admitting: Gastroenterology

## 2020-06-11 ENCOUNTER — Other Ambulatory Visit: Payer: Self-pay

## 2020-06-11 DIAGNOSIS — R1013 Epigastric pain: Secondary | ICD-10-CM

## 2020-06-11 DIAGNOSIS — R112 Nausea with vomiting, unspecified: Secondary | ICD-10-CM

## 2020-06-11 MED ORDER — TECHNETIUM TC 99M MEBROFENIN IV KIT
5.1000 | PACK | Freq: Once | INTRAVENOUS | Status: AC | PRN
  Administered 2020-06-11: 5.1 via INTRAVENOUS

## 2020-06-26 ENCOUNTER — Telehealth: Payer: Self-pay | Admitting: General Surgery

## 2020-06-26 NOTE — Telephone Encounter (Signed)
Attempted to contact pt with no response, phone rings and goes to busy tone

## 2020-06-26 NOTE — Telephone Encounter (Signed)
-----   Message from [facility] V, DO sent at 06/12/2020 10:16 AM EST ----- HIDA scan was normal, with GB ejection fraction 42% (normal greater than 33%).  Patient did have nausea with Ensure consumption, but otherwise an unremarkable study.  While these results seem inconsistent with biliary pathology, based on the nausea with Ensure consumption and nausea being one of his primary symptoms, okay to refer to [facility] for discussion regarding indications of cholecystectomy.  Otherwise, given ongoing symptomatology and otherwise negative work-up, neck step would be referral to academic center for second opinion.

## 2020-06-26 NOTE — Telephone Encounter (Signed)
Tried to contact the patient but no answer, voicemail set in spanish. Will have one of our spanish speaking team members call the patient to advise him of the scan and that we will refer him to [facility]

## 2021-06-15 IMAGING — NM NM HEPATO W/GB/PHARM/[PERSON_NAME]
2 series · 12 of 12 positions shown · non-contrast
Comparison: None.

CLINICAL DATA: Epigastric pain

EXAM:
NUCLEAR MEDICINE HEPATOBILIARY IMAGING WITH GALLBLADDER EF
VIEWS:
Anterior right upper quadrant
RADIOPHARMACEUTICALS:  5.1 mCi 0c-PPm  Choletec IV

[Series 1: hida scan · 3.28mm/px · 6 of 60 frames shown (1 of 2)]
[frame 6/60]
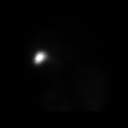
[frame 16/60]
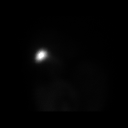
[frame 26/60]
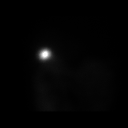
[frame 36/60]
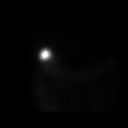
[frame 46/60]
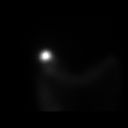
[frame 56/60]
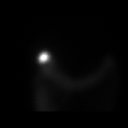

[Series 1: hida scan · 3.28mm/px · 6 of 60 frames shown (2 of 2)]
[frame 6/60]
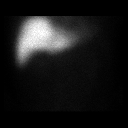
[frame 16/60]
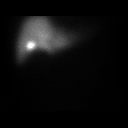
[frame 26/60]
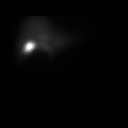
[frame 36/60]
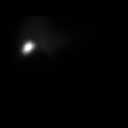
[frame 46/60]
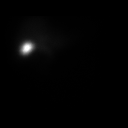
[frame 56/60]
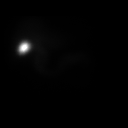

[12 of 12 positions shown; findings below may reference images not displayed]

FINDINGS: Liver uptake of radiotracer is unremarkable. There is prompt
visualization of gallbladder and small bowel, indicating patency of
the cystic and common bile ducts. The patient consumed 8 ounces of
Ensure orally with calculation of the computer generated ejection
fraction of radiotracer from the gallbladder. Patient experienced
nausea with the Ensure consumption. The computer generated ejection
fraction of radiotracer from the gallbladder is normal at 42%,
normal greater than 33% using the oral agent.
IMPRESSION: Normal ejection fraction of radiotracer from the gallbladder.
Patient experienced nausea with the oral Ensure consumption. Cystic
and common bile ducts are patent as is evidenced by visualization of
gallbladder and small bowel.
